# Patient Record
Sex: Male | Born: 1970 | Race: Black or African American | Hispanic: No | Marital: Single | Smoking: Current every day smoker
Health system: Southern US, Community
[De-identification: ages and names within clinical notes are randomized; demographics above are authoritative.]

## PROBLEM LIST (undated history)

## (undated) HISTORY — PX: HERNIA REPAIR: SHX51

---

## 2006-05-23 ENCOUNTER — Emergency Department: Payer: Self-pay | Admitting: Emergency Medicine

## 2006-11-23 IMAGING — CR DG FOOT COMPLETE 3+V*L*
1 series · 3 of 3 positions shown · non-contrast
Comparison: none

REASON FOR EXAM: INJURY
COMMENTS:

PROCEDURE:     DXR - DXR FOOT LT COMP W/OBLIQUES  - May 23, 2006 [DATE]
RESULT:          No acute bony or joint abnormality is identified.  There is
no evidence of fracture or dislocation.

[Series 1: view not recorded · 0.17mm/px · 3 of 3 slices shown]
[im 1/3]
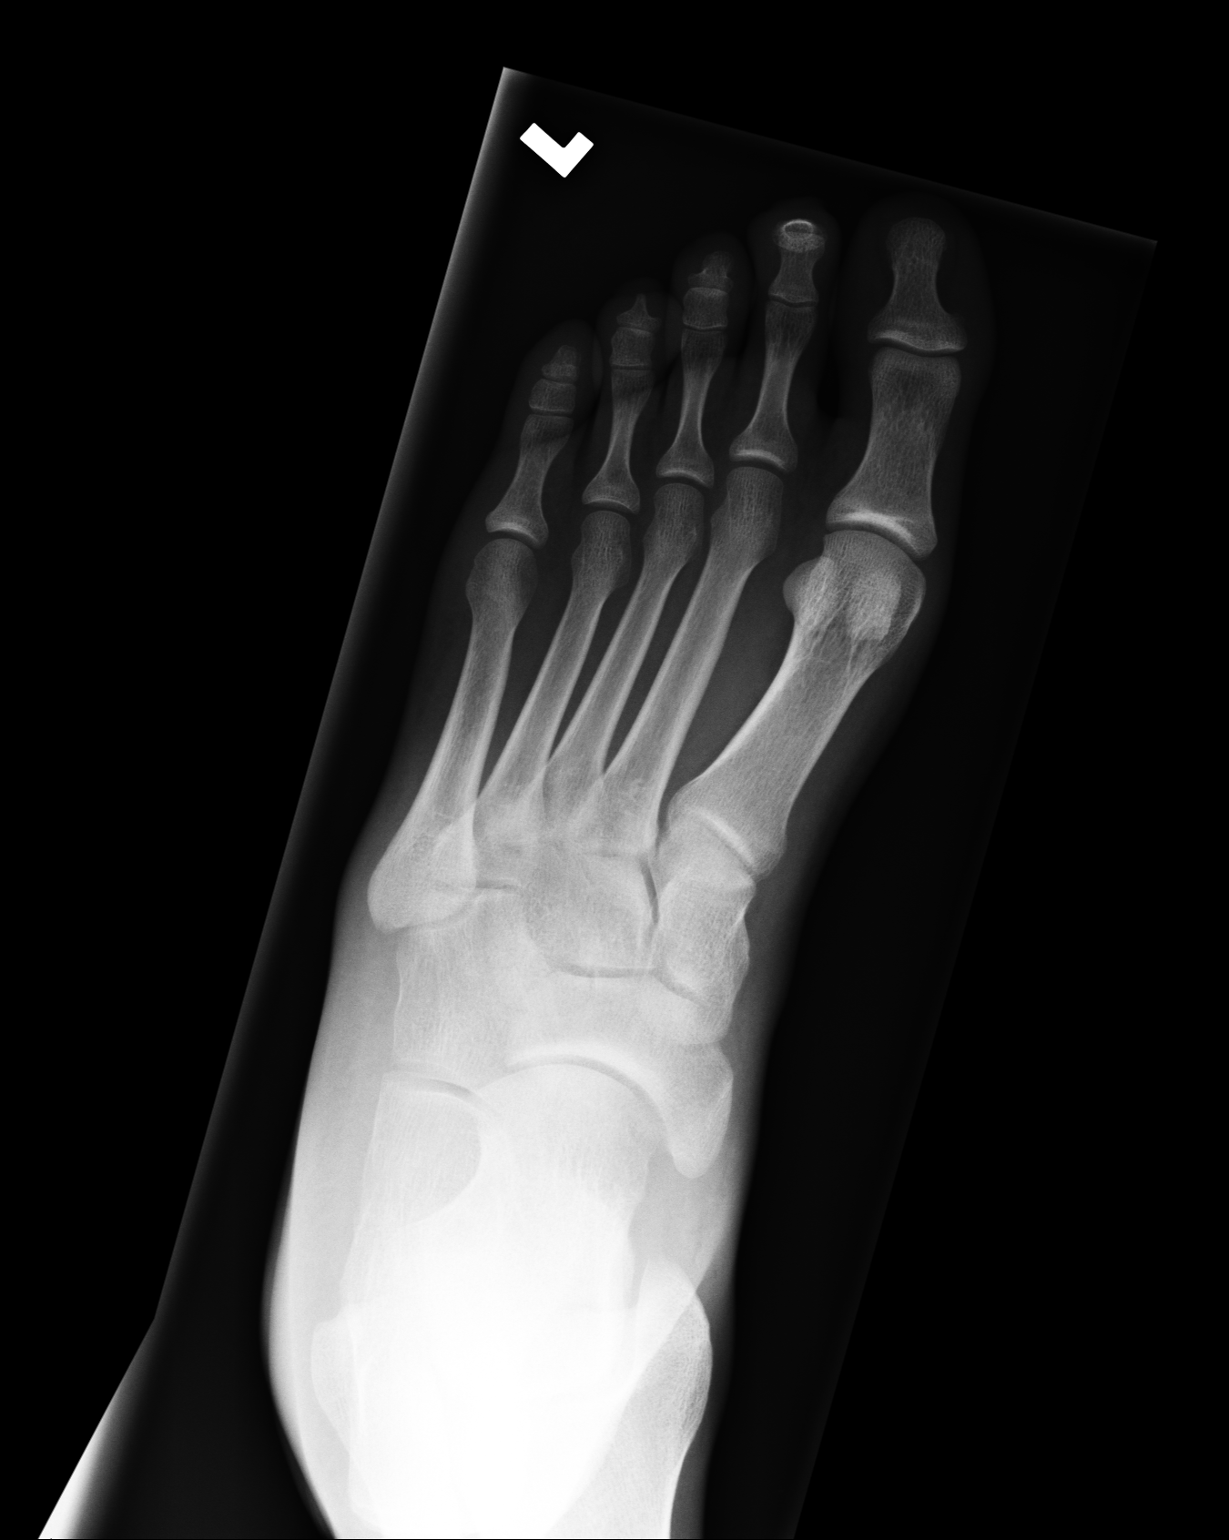
[im 2/3]
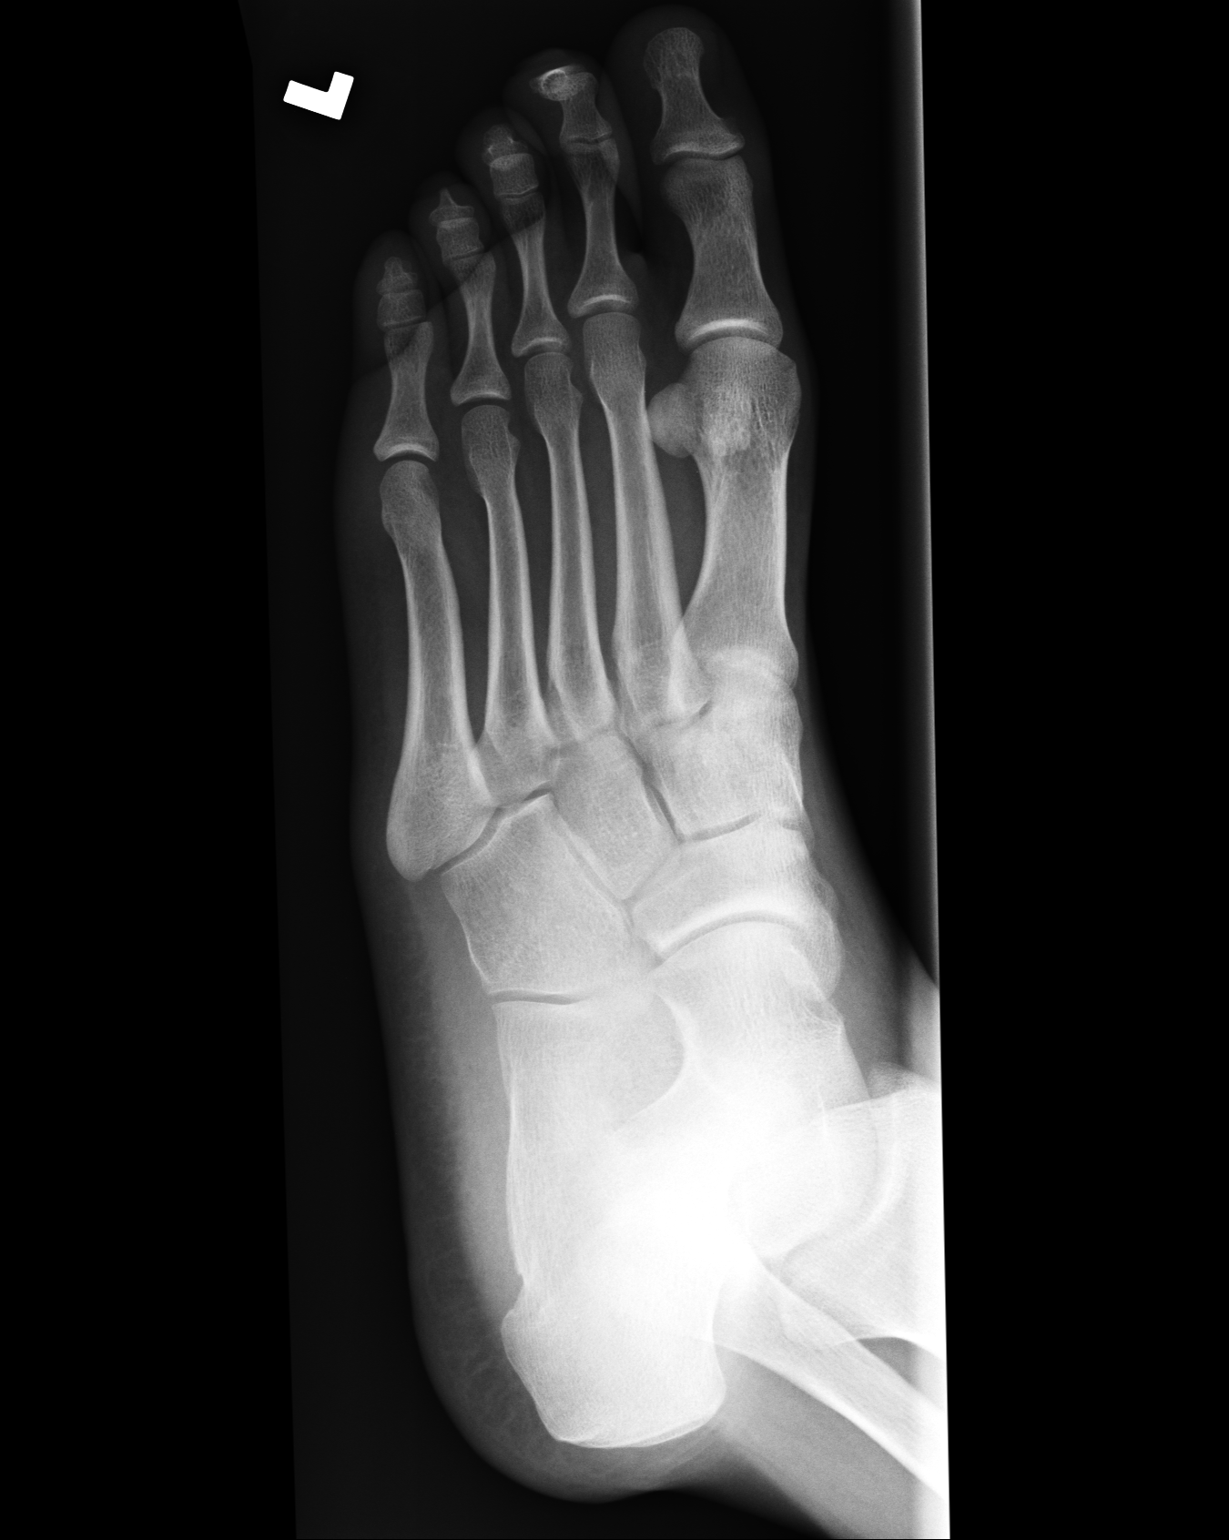
[im 3/3]
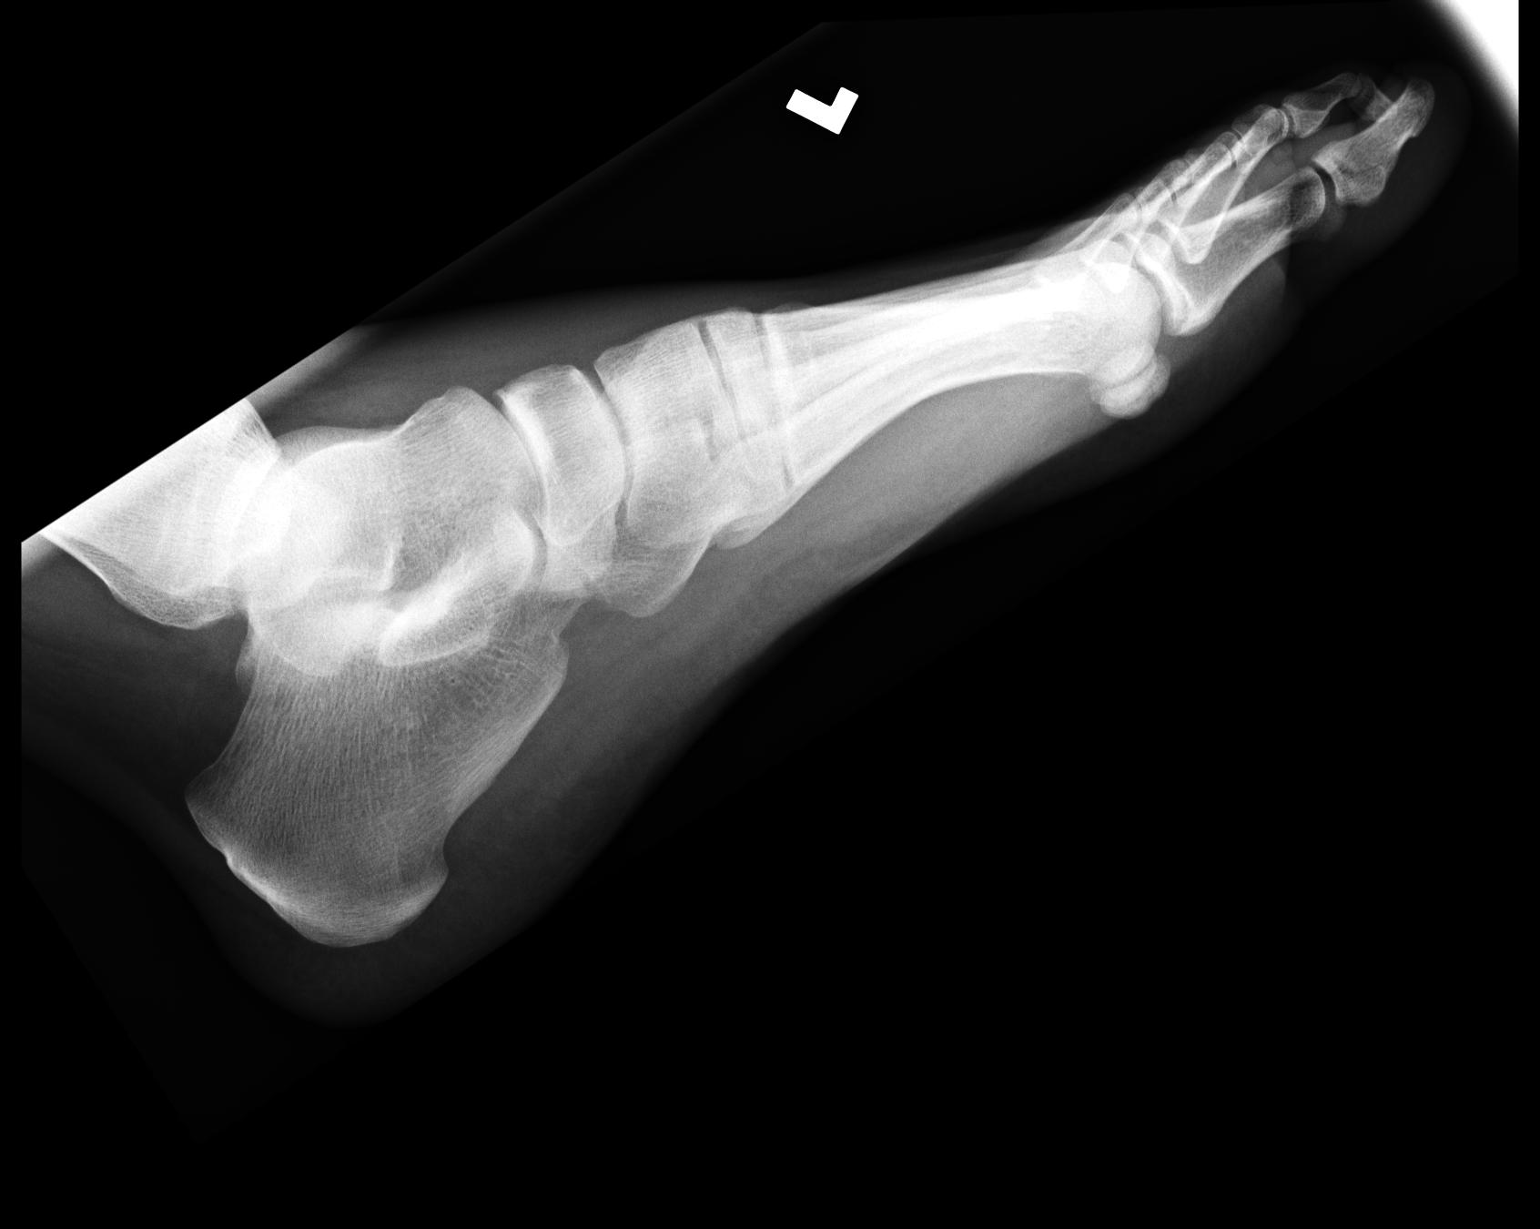

[3 of 3 positions shown; findings below may reference images not displayed]

IMPRESSION: No acute abnormality is identified.

## 2018-03-07 ENCOUNTER — Encounter: Payer: Self-pay | Admitting: Emergency Medicine

## 2018-03-07 ENCOUNTER — Emergency Department
Admission: EM | Admit: 2018-03-07 | Discharge: 2018-03-07 | Disposition: A | Discharge status: Home / Self Care | Payer: Self-pay | Attending: Emergency Medicine | Admitting: Emergency Medicine

## 2018-03-07 DIAGNOSIS — J111 Influenza due to unidentified influenza virus with other respiratory manifestations: Secondary | ICD-10-CM | POA: Insufficient documentation

## 2018-03-07 DIAGNOSIS — F17210 Nicotine dependence, cigarettes, uncomplicated: Secondary | ICD-10-CM | POA: Insufficient documentation

## 2018-03-07 DIAGNOSIS — R69 Illness, unspecified: Secondary | ICD-10-CM

## 2018-03-07 MED ORDER — PROMETHAZINE-CODEINE 6.25-10 MG/5ML PO SYRP: 5.0000 mL | ORAL_SOLUTION | Freq: Four times a day (QID) | ORAL | 0 refills | Status: AC | PRN

## 2018-03-07 NOTE — ED Provider Notes (Signed)
[facility] Emergency Department Provider Note  ____________________________________________  Time seen: Approximately 8:20 PM  I have reviewed the triage vital signs and the nursing notes.   HISTORY  Chief Complaint Nasal Congestion and Generalized Body Aches   HPI Edwin Farrell is a 47 y.o. male who presents emergency department for evaluation and treatment of body aches, chills, and congestion for the past 2 days that has prevented him from going to work.  He has not had any abdominal pain, vomiting, or diarrhea.  No alleviating measures have been attempted for this complaint prior to arrival.  History reviewed. No pertinent past medical history.  There are no active problems to display for this patient.   Past Surgical History:  Procedure Laterality Date  . HERNIA REPAIR      Prior to Admission medications   Medication Sig Start Date End Date Taking? Authorizing Provider  promethazine-codeine (PHENERGAN WITH CODEINE) 6.25-10 MG/5ML syrup Take 5 mLs by mouth every 6 (six) hours as needed for cough. 03/07/18   Chinita Pesterriplett, Whitney Bingaman B, FNP    Allergies Patient has no known allergies.  No family history on file.  Social History Social History   Tobacco Use  . Smoking status: Current Every Day Smoker    Packs/day: 0.50    Types: Cigarettes  . Smokeless tobacco: Never Used  Substance Use Topics  . Alcohol use: Not on file  . Drug use: Not on file    Review of Systems Constitutional: Positive for fever/chills ENT: Negative for sore throat. Cardiovascular: Denies chest pain. Respiratory: Negative for shortness of breath. Positive for cough. Gastrointestinal: Positive for nausea,  no vomiting.  Positive for diarrhea.  Musculoskeletal: Positive for body aches Skin: Negative for rash. Neurological: Positive for headaches ____________________________________________   PHYSICAL EXAM:  VITAL SIGNS: ED Triage Vitals  Enc Vitals Group     BP  03/07/18 1756 (!) 162/84     Pulse Rate 03/07/18 1756 (!) 118     Resp 03/07/18 1756 18     Temp 03/07/18 1756 98.9 F (37.2 C)     Temp Source 03/07/18 1756 Oral     SpO2 03/07/18 1756 99 %     Weight 03/07/18 1757 165 lb (74.8 kg)     Height 03/07/18 1757 6' (1.829 m)     Head Circumference --      Peak Flow --      Pain Score --      Pain Loc --      Pain Edu? --      Excl. in GC? --     Constitutional: Alert and oriented. Acutely ill appearing and in no acute distress. Eyes: Conjunctivae are normal. EOMI. Ears: Bilateral TM injected and mildly erythematous. Nose: No congestion noted; no rhinnorhea. Mouth/Throat: Mucous membranes are moist.  Oropharynx mildly erythematou. Tonsils 1+. Neck: No stridor.  Lymphatic: No palpable cervical lymphadenopathy. Cardiovascular: Normal rate, regular rhythm. Good peripheral circulation. Respiratory: Normal respiratory effort.  No retractions. Breath sounds clear to auscultation. Gastrointestinal: Soft and nontender.  Musculoskeletal: FROM x 4 extremities.  Neurologic:  Normal speech and language.  Skin:  Skin is warm, dry and intact. No rash noted. Psychiatric: Mood and affect are normal. Speech and behavior are normal.  ____________________________________________   LABS (all labs ordered are listed, but only abnormal results are displayed)  Labs Reviewed - No data to display ____________________________________________  EKG  Not indicated. ____________________________________________  RADIOLOGY  Not indicated. ____________________________________________   PROCEDURES  Procedure(s) performed: None  Critical Care performed: No ____________________________________________   INITIAL IMPRESSION / ASSESSMENT AND PLAN / ED COURSE  47 y.o. male who presents to the emergency department for treatment and evaluation of symptoms and exam most consistent with influenza. He will be treated with phenergan with codeine to manage  his symptoms. He was advised to stay well hydrated, take tylenol or ibuprofen and follow up with the PCP of his choice if not improving over the next few days.   Medications - No data to display  ED Discharge Orders        Ordered    promethazine-codeine (PHENERGAN WITH CODEINE) 6.25-10 MG/5ML syrup  Every 6 hours PRN     03/07/18 2027       Pertinent labs & imaging results that were available during my care of the patient were reviewed by me and considered in my medical decision making (see chart for details).    If controlled substance prescribed during this visit, 12 month history viewed on the [facility] prior to issuing an initial prescription for Schedule II or III opiod. ____________________________________________   FINAL CLINICAL IMPRESSION(S) / ED DIAGNOSES  Final diagnoses:  Influenza-like illness    Note:  This document was prepared using Dragon voice recognition software and may include unintentional dictation errors.     Chinita Pester, FNP 03/07/18 2152    Sharman Cheek, MD 03/07/18 2356

## 2018-03-07 NOTE — ED Triage Notes (Signed)
Patient presents to the ED with body aches, chills, and congestion x 2 days.  Patient states, "I haven't been able to go to work for the past 2 days so I figured I need to come up here and get checked out."  Patient is in no obvious distress at this time.  Denies abdominal pain, vomiting and diarrhea.

## 2018-03-07 NOTE — Discharge Instructions (Signed)
You may also take tylenol or ibuprofen for body aches or fever. Return to the ER for symptoms that change or worsen if unable to schedule an appointment.

## 2018-06-20 ENCOUNTER — Other Ambulatory Visit: Payer: Self-pay

## 2018-06-20 ENCOUNTER — Encounter: Payer: Self-pay | Admitting: *Deleted

## 2018-06-20 ENCOUNTER — Emergency Department
Admission: EM | Admit: 2018-06-20 | Discharge: 2018-06-20 | Disposition: A | Discharge status: Home / Self Care | Payer: Self-pay | Attending: Emergency Medicine | Admitting: Emergency Medicine

## 2018-06-20 DIAGNOSIS — Z5321 Procedure and treatment not carried out due to patient leaving prior to being seen by health care provider: Secondary | ICD-10-CM | POA: Insufficient documentation

## 2018-06-20 DIAGNOSIS — R1110 Vomiting, unspecified: Secondary | ICD-10-CM | POA: Insufficient documentation

## 2018-06-20 DIAGNOSIS — R109 Unspecified abdominal pain: Secondary | ICD-10-CM | POA: Insufficient documentation

## 2018-06-20 LAB — URINALYSIS, COMPLETE (UACMP) WITH MICROSCOPIC
BILIRUBIN URINE: NEGATIVE
Glucose, UA: NEGATIVE mg/dL
Hgb urine dipstick: NEGATIVE
Ketones, ur: 20 mg/dL — AB
LEUKOCYTES UA: NEGATIVE
Nitrite: NEGATIVE
PH: 8 (ref 5.0–8.0)
Protein, ur: 30 mg/dL — AB
SPECIFIC GRAVITY, URINE: 1.025 (ref 1.005–1.030)

## 2018-06-20 LAB — COMPREHENSIVE METABOLIC PANEL
ALT: 12 U/L (ref 0–44)
AST: 19 U/L (ref 15–41)
Albumin: 3.5 g/dL (ref 3.5–5.0)
Alkaline Phosphatase: 57 U/L (ref 38–126)
Anion gap: 10 (ref 5–15)
BUN: 12 mg/dL (ref 6–20)
CHLORIDE: 100 mmol/L (ref 98–111)
CO2: 27 mmol/L (ref 22–32)
Calcium: 8.7 mg/dL — ABNORMAL LOW (ref 8.9–10.3)
Creatinine, Ser: 0.99 mg/dL (ref 0.61–1.24)
GFR calc Af Amer: >60 mL/min (ref >60)
GLUCOSE: 126 mg/dL — AB (ref 70–99)
Potassium: 3.4 mmol/L — ABNORMAL LOW (ref 3.5–5.1)
Sodium: 137 mmol/L (ref 135–145)
TOTAL PROTEIN: 7.4 g/dL (ref 6.5–8.1)
Total Bilirubin: 0.4 mg/dL (ref 0.3–1.2)

## 2018-06-20 LAB — CBC
HCT: 42.2 % (ref 40.0–52.0)
Hemoglobin: 14.4 g/dL (ref 13.0–18.0)
MCH: 28 pg (ref 26.0–34.0)
MCHC: 34.1 g/dL (ref 32.0–36.0)
MCV: 82.1 fL (ref 80.0–100.0)
PLATELETS: 331 10*3/uL (ref 150–440)
RBC: 5.15 MIL/uL (ref 4.40–5.90)
RDW: 14.8 % — AB (ref 11.5–14.5)
WBC: 13.1 10*3/uL — AB (ref 3.8–10.6)

## 2018-06-20 LAB — LIPASE, BLOOD: Lipase: 32 U/L (ref 11–51)

## 2018-06-20 MED ORDER — ONDANSETRON 4 MG PO TBDP
4.0000 mg | ORAL_TABLET | Freq: Once | ORAL | Status: AC | PRN
  Administered 2018-06-20: 4 mg via ORAL
  Filled 2018-06-20: qty 1

## 2018-06-20 MED ORDER — SODIUM CHLORIDE 0.9 % IV BOLUS
500.0000 mL | Freq: Once | INTRAVENOUS | Status: AC
  Administered 2018-06-20: 500 mL via INTRAVENOUS

## 2018-06-20 NOTE — ED Notes (Signed)
IV fluids complete; pt updated on wait time; verbalized understanding

## 2018-06-20 NOTE — ED Notes (Signed)
Called at stat desk by ed secretary stating pt used the call bell and wants his IV out to go home; before I could speak to pt, called charge nurse as pt was going to get next bed-pt still up for next bed assignment per charge nurse; back to speak with pt to let him know he was gong to get the next open bed; pt's visitor says they've been back there since 7pm and "nobody has told us nothing or done nothing"; encouraged pt to stay for treatment room but he declines; says he just wants to go home to bed as he's exhausted, says he will return tomorrow "to try again" if he still feels bad; pt ambulatory with steady gait upon leaving

## 2018-06-20 NOTE — ED Notes (Signed)
Patient given a warm blanket at this time.  

## 2018-06-20 NOTE — ED Notes (Signed)
Remote control provided for visitor at her request

## 2018-06-20 NOTE — ED Notes (Addendum)
Stopped in sub wait to speak with pt; visitor says she thought pt was to get IV fluids; explained to her that was not reported to me but I'm happy to check with MD for order;

## 2018-06-20 NOTE — ED Triage Notes (Signed)
Pt to ED reporting left sided abd pain and vomiting foe the past 4 days. PT has been unable to keep food or liquids down. Pt reports 2-4 episodes of vomiting today. Pt also reports having dark stools and dark blood in his vomit. Pt also reporting feeling feverish but has not taken his temp at home.   Hx of hernias and is requesting to have his groin assessed due to bilateral groin masses. No changes in urine reported

## 2018-06-20 NOTE — ED Notes (Signed)
Pt understands fluids have been ordered but no pain medication at this time;

## 2018-06-20 NOTE — ED Notes (Signed)
First nurse: pt arrived to front desk with reports that he cant "keep anything down" mean while eating popsicle.
